# Patient Record
Sex: Male | Born: 1960 | Race: White | Hispanic: No | Marital: Married | Smoking: Current every day smoker
Health system: Southern US, Community
[De-identification: ages and names within clinical notes are randomized; demographics above are authoritative.]

## PROBLEM LIST (undated history)

## (undated) DIAGNOSIS — K5792 Diverticulitis of intestine, part unspecified, without perforation or abscess without bleeding: Secondary | ICD-10-CM

## (undated) DIAGNOSIS — I2510 Atherosclerotic heart disease of native coronary artery without angina pectoris: Secondary | ICD-10-CM

## (undated) DIAGNOSIS — I7300 Raynaud's syndrome without gangrene: Secondary | ICD-10-CM

## (undated) DIAGNOSIS — K5190 Ulcerative colitis, unspecified, without complications: Secondary | ICD-10-CM

## (undated) HISTORY — PX: CORONARY STENT PLACEMENT: SHX1402

---

## 1998-08-20 ENCOUNTER — Ambulatory Visit (HOSPITAL_COMMUNITY): Admission: RE | Admit: 1998-08-20 | Discharge: 1998-08-20 | Payer: Self-pay | Admitting: Gastroenterology

## 1999-01-17 ENCOUNTER — Encounter: Payer: Self-pay | Admitting: Emergency Medicine

## 1999-01-17 ENCOUNTER — Inpatient Hospital Stay (HOSPITAL_COMMUNITY): Admission: EM | Admit: 1999-01-17 | Discharge: 1999-01-20 | Payer: Self-pay | Admitting: Emergency Medicine

## 1999-01-17 ENCOUNTER — Encounter: Payer: Self-pay | Admitting: Pulmonary Disease

## 1999-11-19 ENCOUNTER — Emergency Department (HOSPITAL_COMMUNITY): Admission: EM | Admit: 1999-11-19 | Discharge: 1999-11-19 | Payer: Self-pay | Admitting: Emergency Medicine

## 2000-09-13 ENCOUNTER — Inpatient Hospital Stay (HOSPITAL_COMMUNITY): Admission: EM | Admit: 2000-09-13 | Discharge: 2000-09-17 | Payer: Self-pay | Admitting: Emergency Medicine

## 2000-09-13 ENCOUNTER — Encounter: Payer: Self-pay | Admitting: Emergency Medicine

## 2000-09-14 ENCOUNTER — Encounter: Payer: Self-pay | Admitting: *Deleted

## 2001-07-17 ENCOUNTER — Emergency Department (HOSPITAL_COMMUNITY): Admission: EM | Admit: 2001-07-17 | Discharge: 2001-07-17 | Payer: Self-pay | Admitting: Emergency Medicine

## 2002-01-11 ENCOUNTER — Emergency Department (HOSPITAL_COMMUNITY): Admission: EM | Admit: 2002-01-11 | Discharge: 2002-01-11 | Payer: Self-pay | Admitting: Emergency Medicine

## 2002-01-12 ENCOUNTER — Emergency Department (HOSPITAL_COMMUNITY): Admission: EM | Admit: 2002-01-12 | Discharge: 2002-01-12 | Payer: Self-pay | Admitting: Emergency Medicine

## 2003-10-10 ENCOUNTER — Emergency Department (HOSPITAL_COMMUNITY): Admission: EM | Admit: 2003-10-10 | Discharge: 2003-10-10 | Payer: Self-pay | Admitting: Emergency Medicine

## 2004-03-23 ENCOUNTER — Emergency Department (HOSPITAL_COMMUNITY): Admission: EM | Admit: 2004-03-23 | Discharge: 2004-03-24 | Payer: Self-pay | Admitting: Emergency Medicine

## 2004-11-29 ENCOUNTER — Ambulatory Visit: Payer: Self-pay | Admitting: Pulmonary Disease

## 2004-12-22 ENCOUNTER — Ambulatory Visit: Payer: Self-pay | Admitting: Gastroenterology

## 2005-01-03 ENCOUNTER — Ambulatory Visit: Payer: Self-pay | Admitting: Cardiovascular Disease

## 2005-01-18 ENCOUNTER — Ambulatory Visit: Payer: Self-pay

## 2005-02-14 ENCOUNTER — Ambulatory Visit: Payer: Self-pay | Admitting: Gastroenterology

## 2005-02-22 ENCOUNTER — Ambulatory Visit: Payer: Self-pay | Admitting: Gastroenterology

## 2005-03-17 ENCOUNTER — Ambulatory Visit: Payer: Self-pay | Admitting: Cardiovascular Disease

## 2006-04-27 ENCOUNTER — Emergency Department (HOSPITAL_COMMUNITY): Admission: EM | Admit: 2006-04-27 | Discharge: 2006-04-27 | Payer: Self-pay | Admitting: Emergency Medicine

## 2007-08-24 ENCOUNTER — Encounter: Payer: Self-pay | Admitting: Internal Medicine

## 2010-08-03 ENCOUNTER — Encounter: Payer: Self-pay | Admitting: Pulmonary Disease

## 2010-11-09 NOTE — Miscellaneous (Signed)
Summary: Flu Vaccination/[facility]  Flu Vaccination/[facility]   Imported By: Sherian Rein 08/13/2010 08:32:24  _____________________________________________________________________  External Attachment:    Type:   Image     Comment:   External Document

## 2012-05-09 ENCOUNTER — Emergency Department (HOSPITAL_COMMUNITY): Payer: BC Managed Care – PPO

## 2012-05-09 ENCOUNTER — Telehealth: Payer: Self-pay | Admitting: Pulmonary Disease

## 2012-05-09 ENCOUNTER — Emergency Department (HOSPITAL_COMMUNITY)
Admission: EM | Admit: 2012-05-09 | Discharge: 2012-05-09 | Disposition: A | Discharge status: Home / Self Care | Payer: BC Managed Care – PPO | Attending: Emergency Medicine | Admitting: Emergency Medicine

## 2012-05-09 ENCOUNTER — Encounter (HOSPITAL_COMMUNITY): Payer: Self-pay | Admitting: Emergency Medicine

## 2012-05-09 DIAGNOSIS — K625 Hemorrhage of anus and rectum: Secondary | ICD-10-CM | POA: Insufficient documentation

## 2012-05-09 DIAGNOSIS — N201 Calculus of ureter: Secondary | ICD-10-CM | POA: Insufficient documentation

## 2012-05-09 DIAGNOSIS — N23 Unspecified renal colic: Secondary | ICD-10-CM

## 2012-05-09 DIAGNOSIS — N200 Calculus of kidney: Secondary | ICD-10-CM

## 2012-05-09 DIAGNOSIS — K5730 Diverticulosis of large intestine without perforation or abscess without bleeding: Secondary | ICD-10-CM | POA: Insufficient documentation

## 2012-05-09 DIAGNOSIS — R109 Unspecified abdominal pain: Secondary | ICD-10-CM | POA: Insufficient documentation

## 2012-05-09 HISTORY — DX: Diverticulitis of intestine, part unspecified, without perforation or abscess without bleeding: K57.92

## 2012-05-09 HISTORY — DX: Raynaud's syndrome without gangrene: I73.00

## 2012-05-09 HISTORY — DX: Atherosclerotic heart disease of native coronary artery without angina pectoris: I25.10

## 2012-05-09 HISTORY — DX: Ulcerative colitis, unspecified, without complications: K51.90

## 2012-05-09 LAB — URINALYSIS, ROUTINE W REFLEX MICROSCOPIC
Ketones, ur: NEGATIVE mg/dL
Leukocytes, UA: NEGATIVE

## 2012-05-09 LAB — CBC WITH DIFFERENTIAL/PLATELET
Basophils Relative: 1 % (ref 0–1)
Hemoglobin: 16.9 g/dL (ref 13.0–17.0)
Lymphs Abs: 2.8 10*3/uL (ref 0.7–4.0)
MCH: 33.7 pg (ref 26.0–34.0)
MCV: 93.4 fL (ref 78.0–100.0)
Neutro Abs: 8.5 10*3/uL — ABNORMAL HIGH (ref 1.7–7.7)
RDW: 13.1 % (ref 11.5–15.5)
WBC: 12.7 10*3/uL — ABNORMAL HIGH (ref 4.0–10.5)

## 2012-05-09 LAB — BASIC METABOLIC PANEL
BUN: 9 mg/dL (ref 6–23)
CO2: 29 mEq/L (ref 19–32)
Calcium: 9.6 mg/dL (ref 8.4–10.5)
Creatinine, Ser: 1.03 mg/dL (ref 0.50–1.35)
GFR calc Af Amer: >90 mL/min (ref >90)
GFR calc non Af Amer: 82 mL/min — ABNORMAL LOW (ref >90)
Glucose, Bld: 89 mg/dL (ref 70–99)

## 2012-05-09 LAB — URINE MICROSCOPIC-ADD ON

## 2012-05-09 MED ORDER — MORPHINE SULFATE 4 MG/ML IJ SOLN
4.0000 mg | Freq: Once | INTRAMUSCULAR | Status: AC
  Administered 2012-05-09: 4 mg via INTRAVENOUS
  Filled 2012-05-09: qty 1

## 2012-05-09 MED ORDER — OXYCODONE-ACETAMINOPHEN 5-325 MG PO TABS: 1.0000 | ORAL_TABLET | ORAL | Status: AC | PRN

## 2012-05-09 MED ORDER — TAMSULOSIN HCL 0.4 MG PO CAPS: 0.4000 mg | ORAL_CAPSULE | ORAL | Status: AC

## 2012-05-09 NOTE — ED Notes (Signed)
MD at bedside. 

## 2012-05-09 NOTE — ED Provider Notes (Addendum)
History     CSN: [id number]  Arrival date & time 05/09/12  1545   First MD Initiated Contact with Patient 05/09/12 1733      Chief Complaint  Patient presents with  . Nephrolithiasis    (Consider location/radiation/quality/duration/timing/severity/associated sxs/prior treatment) HPI Comments: Patient presents with several days of right flank pain radiating down towards his right groin.  He states it is consistent with past kidney stones.  He did pass a kidney stone on Monday but has had persistent pain although less intense.  He has not noted any gross hematuria or dysuria.  Yesterday he also noted some gross bright red blood per rectum with bowel movements.  This occurred on 3 occasions.  He's had no further episodes today.  He has a known history of ulcerative colitis and diverticulosis as well.  He notes that several times a year he will have exacerbations with bleeding.  He denies any other abdominal pain, fevers or nausea or vomiting today.  He did have associated nausea and vomiting on Monday when he passed a kidney stone.  Patient is not on any anticoagulation or antiplatelet agents.  He has no left lower quadrant pain.  He did take to ciprofloxacin yesterday out of concern for possible diverticulitis.  The history is provided by the patient. No language interpreter was used.    Past Medical History  Diagnosis Date  . Coronary artery disease   . Raynaud disease   . Ulcerative colitis   . Diverticulitis     Past Surgical History  Procedure Date  . Coronary stent placement     History reviewed. No pertinent family history.  History  Substance Use Topics  . Smoking status: Current Everyday Smoker  . Smokeless tobacco: Not on file  . Alcohol Use: No      Review of Systems  Constitutional: Negative.  Negative for fever and chills.  HENT: Negative.   Eyes: Negative.   Respiratory: Negative.  Negative for cough and shortness of breath.   Cardiovascular: Negative.   Negative for chest pain.  Gastrointestinal: Positive for nausea, vomiting and anal bleeding. Negative for abdominal pain.  Genitourinary: Positive for hematuria and flank pain.  Musculoskeletal: Negative.  Negative for back pain.  Skin: Negative.  Negative for color change and rash.  Neurological: Negative for syncope and headaches.  Hematological: Negative.  Negative for adenopathy.  Psychiatric/Behavioral: Negative.  Negative for confusion.  All other systems reviewed and are negative.    Allergies  Review of patient's allergies indicates no known allergies.  Home Medications   Current Outpatient Rx  Name Route Sig Dispense Refill  . ALPRAZOLAM ER 2 MG PO TB24 Oral Take 2 mg by mouth every morning.    Marland Kitchen ALPRAZOLAM 1 MG PO TABS Oral Take 1 mg by mouth 3 (three) times daily as needed. anxiety    . HYDROCODONE-ACETAMINOPHEN 5-325 MG PO TABS Oral Take 1 tablet by mouth every 6 (six) hours as needed. pain    . LISINOPRIL 40 MG PO TABS Oral Take 40 mg by mouth daily.      BP 123/83  Pulse 80  Temp 98.6 F (37 C)  SpO2 100%  Physical Exam  Nursing note and vitals reviewed. Constitutional: He is oriented to person, place, and time. He appears well-developed and well-nourished.  Non-toxic appearance. He does not have a sickly appearance.  HENT:  Head: Normocephalic and atraumatic.  Eyes: Conjunctivae, EOM and lids are normal. Pupils are equal, round, and reactive to light.  Neck:  Trachea normal, normal range of motion and full passive range of motion without pain. Neck supple.  Cardiovascular: Normal rate, regular rhythm and normal heart sounds.  Exam reveals no gallop.   No murmur heard. Pulmonary/Chest: Effort normal and breath sounds normal. No respiratory distress.  Abdominal: Soft. Normal appearance. He exhibits no distension. There is no tenderness. There is no rebound and no CVA tenderness.  Genitourinary:       Normal rectal tone.  Brown stool.  No gross blood or melena    Musculoskeletal: Normal range of motion.  Neurological: He is alert and oriented to person, place, and time. He has normal strength.  Skin: Skin is warm, dry and intact. No rash noted.  Psychiatric: He has a normal mood and affect. His behavior is normal. Judgment and thought content normal.    ED Course  Procedures (including critical care time)  Results for orders placed during the hospital encounter of 05/09/12  URINALYSIS, ROUTINE W REFLEX MICROSCOPIC      Component Value Range   Color, Urine YELLOW  YELLOW   APPearance CLEAR  CLEAR   Specific Gravity, Urine 1.009  1.005 - 1.030   pH 6.5  5.0 - 8.0   Glucose, UA NEGATIVE  NEGATIVE mg/dL   Hgb urine dipstick MODERATE (*) NEGATIVE   Bilirubin Urine NEGATIVE  NEGATIVE   Ketones, ur NEGATIVE  NEGATIVE mg/dL   Protein, ur NEGATIVE  NEGATIVE mg/dL   Urobilinogen, UA 0.2  0.0 - 1.0 mg/dL   Nitrite NEGATIVE  NEGATIVE   Leukocytes, UA NEGATIVE  NEGATIVE  BASIC METABOLIC PANEL      Component Value Range   Sodium 139  135 - 145 mEq/L   Potassium 3.7  3.5 - 5.1 mEq/L   Chloride 102  96 - 112 mEq/L   CO2 29  19 - 32 mEq/L   Glucose, Bld 89  70 - 99 mg/dL   BUN 9  6 - 23 mg/dL   Creatinine, Ser 9.62  0.50 - 1.35 mg/dL   Calcium 9.6  8.4 - 95.2 mg/dL   GFR calc non Af Amer 82 (*) >90 mL/min   GFR calc Af Amer >90  >90 mL/min  CBC WITH DIFFERENTIAL      Component Value Range   WBC 12.7 (*) 4.0 - 10.5 K/uL   RBC 5.01  4.22 - 5.81 MIL/uL   Hemoglobin 16.9  13.0 - 17.0 g/dL   HCT 84.1  32.4 - 40.1 %   MCV 93.4  78.0 - 100.0 fL   MCH 33.7  26.0 - 34.0 pg   MCHC 36.1 (*) 30.0 - 36.0 g/dL   RDW 02.7  25.3 - 66.4 %   Platelets 281  150 - 400 K/uL   Neutrophils Relative 67  43 - 77 %   Neutro Abs 8.5 (*) 1.7 - 7.7 K/uL   Lymphocytes Relative 22  12 - 46 %   Lymphs Abs 2.8  0.7 - 4.0 K/uL   Monocytes Relative 9  3 - 12 %   Monocytes Absolute 1.1 (*) 0.1 - 1.0 K/uL   Eosinophils Relative 1  0 - 5 %   Eosinophils Absolute 0.2  0.0 -  0.7 K/uL   Basophils Relative 1  0 - 1 %   Basophils Absolute 0.1  0.0 - 0.1 K/uL  URINE MICROSCOPIC-ADD ON      Component Value Range   RBC / HPF 3-6  <3 RBC/hpf   Ct Abdomen Pelvis Wo Contrast  05/09/2012  *  RADIOLOGY REPORT*  Clinical Data: Right flank pain.  History of stones.  History of rectal bleeding with UC and diverticulosis.  History of kidney stones.  CT ABDOMEN AND PELVIS WITHOUT CONTRAST  Technique:  Multidetector CT imaging of the abdomen and pelvis was performed following the standard protocol without intravenous contrast.  Comparison: CT 03/23/2004  Findings: The lung bases are unremarkable in appearance.  There is moderate right hydronephrosis and hydroureter secondary to a stone in the ureter which measures 5 mm.  This is seen at the level of L4-5.  No focal abnormality identified within the liver, spleen, pancreas, or adrenal glands.  Low attenuation lesion is identified within the upper pole of the left kidney.  This measures 2.2 cm and likely represents a cyst.  No intrarenal or ureteral stones are identified on the left.  The stomach and small bowel loops have a normal appearance. The appendix is well seen and has a normal appearance.  There are numerous diverticula throughout the colon, especially involving the descending and sigmoid segments.  No associated inflammatory changes to suggest acute diverticulitis.  Gallbladder is present.  Prostatic calcifications are present.  There is atherosclerotic calcification of the abdominal aorta.  No aneurysm. No retroperitoneal or mesenteric adenopathy.  Degenerative changes are seen in the spine.  IMPRESSION:  1.  5 mm right ureteral stone at the level of L4-5, causing right hydronephrosis and hydroureter. 2.  Significant diverticulosis without evidence for acute diverticulitis.  Original Report Authenticated By: Patterson Hammersmith, M.D.      MDM  Patient presents with symptoms that are likely consistent with a kidney stone.  He does  give the history for rectal bleeding but has no left lower quadrant tenderness, fevers or other signs of infection to suggest diverticulitis clinically.  Patient has no gross blood or melena on exam.  He's had no bleeding today as well.  Given patient's history for ulcerative colitis this is likely bleeding related to that and his underlying diverticulosis.  Patient will have a CT scan to assess for further kidney stones at this time.  Patient does not require pain medicine at this time since he took oxycodone at home.   Nat Christen, MD 05/09/12 [phone]  Patient with a CT that is consistent with kidney stone which I believe is most consistent with the patient's pain.  I will advise the patient that he should not continue to take the ciprofloxacin.  I have counseled him that he should have followup with his GI specialist given the bleeding and his known history of ulcerative colitis which puts him at higher risk for cancer.  Patient understands this and he plans to followup.  I believe patient is safe for discharge home.    Nat Christen, MD 05/09/12 2011

## 2012-05-09 NOTE — ED Notes (Addendum)
Pt has hx of kidney stones and current symptoms started Monday, reports having a kidney stone flare up with c/o right flank pain.  Was seen at urgent care Monday for right flank pain.  Pt also reports bright red blood in stool  Yesterday.  Pt currently taking cipro for diverticulitis.

## 2012-05-09 NOTE — Telephone Encounter (Signed)
Please advise SN thanks 

## 2012-05-09 NOTE — Telephone Encounter (Signed)
Per SN---we have no openings --unable to take new pts at this time.  Suggest [place] [facility] or one of the satellite office is near him to set him up with primary care.  thanks

## 2012-05-09 NOTE — Telephone Encounter (Signed)
[id number] x1 

## 2012-05-10 NOTE — Telephone Encounter (Signed)
Pt aware. Jennifer Castillo, CMA  

## 2012-09-10 ENCOUNTER — Emergency Department (HOSPITAL_COMMUNITY)
Admission: EM | Admit: 2012-09-10 | Discharge: 2012-09-10 | Disposition: A | Discharge status: Home / Self Care | Payer: BC Managed Care – PPO | Source: Home / Self Care | Attending: Emergency Medicine | Admitting: Emergency Medicine

## 2012-09-10 ENCOUNTER — Encounter (HOSPITAL_COMMUNITY): Payer: Self-pay | Admitting: Emergency Medicine

## 2012-09-10 DIAGNOSIS — Z5189 Encounter for other specified aftercare: Secondary | ICD-10-CM

## 2012-09-10 DIAGNOSIS — L0291 Cutaneous abscess, unspecified: Secondary | ICD-10-CM

## 2012-09-10 DIAGNOSIS — I2510 Atherosclerotic heart disease of native coronary artery without angina pectoris: Secondary | ICD-10-CM

## 2012-09-10 DIAGNOSIS — L0390 Cellulitis, unspecified: Secondary | ICD-10-CM

## 2012-09-10 MED ORDER — SULFAMETHOXAZOLE-TMP DS 800-160 MG PO TABS: 2.0000 | ORAL_TABLET | Freq: Two times a day (BID) | ORAL | Status: DC

## 2012-09-10 MED ORDER — TRAMADOL HCL 50 MG PO TABS: 100.0000 mg | ORAL_TABLET | Freq: Three times a day (TID) | ORAL | Status: DC | PRN

## 2012-09-10 MED ORDER — LISINOPRIL 40 MG PO TABS: 40.0000 mg | ORAL_TABLET | Freq: Every day | ORAL | Status: AC

## 2012-09-10 MED ORDER — HYDROCODONE-ACETAMINOPHEN 5-325 MG PO TABS: ORAL_TABLET | ORAL | Status: AC

## 2012-09-10 NOTE — ED Notes (Addendum)
Reports he has a nodule on his upper left buttocks.  Patient says this past week it started getting bigger and redder.    Patient states he did start taking CIPRO 500 mg for three days.  Refill on bp medication

## 2012-09-10 NOTE — ED Provider Notes (Signed)
Chief Complaint  Patient presents with  . Abscess  . Medication Refill    History of Present Illness:    Webster is a 51 year old male who has had a one-week history of a boil on his left buttock. He got an injection in that area about 4-5 months ago and has had a small pea-sized lump ever since then. The injection was promethazine plus another pain medication. He got this at [facility] and then he came over here because of a kidney stone. The lump has gotten bigger over the past week and is tender to touch. It has not drained any fluid or pus. He denies any fever or chills.  He also needs a refill for his lisinopril 40 mg a day. He has hypertension coronary artery disease. He had a heart attack in 2001. He's being followed by Dr. Eden Emms. He denies any chest pain, pressure, tightness, or shortness of breath. He does not have her primary care physician.  Review of Systems:  Other than noted above, the patient denies any of the following symptoms: Systemic:  No fever, chills or sweats. Skin:  No rash or itching.  PMFSH:  Past medical history, family history, social history, meds, and allergies were reviewed.  No history of diabetes or prior history of abscesses or MRSA.  Physical Exam:   Vital signs:  BP 134/87  Pulse 81  Temp 99.4 F (37.4 C) (Oral)  Resp 18  SpO2 99% Skin:  There is a 2.5 cm, round, raised, red, fluctuant mass in the upper outer quadrant of the left buttock.  Skin exam was otherwise normal.  No rash. Ext:  Distal pulses were full, patient has full ROM of all joints.    Procedure:  Verbal informed consent was obtained.  The patient was informed of the risks and benefits of the procedure and understands and accepts.  Identity of the patient was verified verbally and by wristband.   The abscess area described above was prepped with Betadine and alcohol and anesthetized with 4 mL of 2% Xylocaine with epinephrine.  Using a #11 scalpel blade, a singe straight  incision was made into the area of fluctulence, yielding a moderate amount of prurulent drainage.  Routine cultures were obtained.  Blunt dissection was used to break up loculations and the resulting wound cavity was packed with 1/4 inch Iodoform gauze.  A sterile pressure dressing was applied.  Assessment:  The primary encounter diagnosis was Abscess. A diagnosis of Coronary artery disease was also pertinent to this visit.  Plan:   1.  The following meds were prescribed:   New Prescriptions   HYDROCODONE-ACETAMINOPHEN (NORCO/VICODIN) 5-325 MG PER TABLET    1 to 2 tabs every 4 to 6 hours as needed for pain.   LISINOPRIL (PRINIVIL,ZESTRIL) 40 MG TABLET    Take 1 tablet (40 mg total) by mouth daily.   SULFAMETHOXAZOLE-TRIMETHOPRIM (BACTRIM DS) 800-160 MG PER TABLET    Take 2 tablets by mouth 2 (two) times daily.   TRAMADOL (ULTRAM) 50 MG TABLET    Take 2 tablets (100 mg total) by mouth every 8 (eight) hours as needed for pain.   2.  The patient was instructed in symptomatic care and handouts were given. 3.  The patient was instructed to leave the dressing in place and return again in 48 hours for packing removal.   Reuben Likes, MD 09/10/12 1451

## 2012-09-12 ENCOUNTER — Emergency Department (INDEPENDENT_AMBULATORY_CARE_PROVIDER_SITE_OTHER)
Admission: EM | Admit: 2012-09-12 | Discharge: 2012-09-12 | Disposition: A | Discharge status: Home / Self Care | Payer: BC Managed Care – PPO | Source: Home / Self Care | Attending: Emergency Medicine | Admitting: Emergency Medicine

## 2012-09-12 ENCOUNTER — Encounter (HOSPITAL_COMMUNITY): Payer: Self-pay | Admitting: *Deleted

## 2012-09-12 DIAGNOSIS — Z5189 Encounter for other specified aftercare: Secondary | ICD-10-CM

## 2012-09-12 NOTE — ED Notes (Signed)
Pt  Here  For  Recheck       Of  abcess     Possible  Packing  Removal

## 2012-09-12 NOTE — ED Provider Notes (Signed)
History     CSN: [id number]  Arrival date & time 09/12/12  0818   None     Chief Complaint  Patient presents with  . Wound Check    wound  check    (Consider location/radiation/quality/duration/timing/severity/associated sxs/prior treatment) Patient is a 51 y.o. male presenting with wound check. The history is provided by the patient.  Wound Check  He was treated in the ED 2 to 3 days ago. Previous treatment in the ED includes I&D of abscess. Treatments since wound repair include a wound recheck and oral antibiotics. The fever has been present for less than 1 day. There has been bloody discharge from the wound. The redness has improved. The swelling has improved. The pain has improved. He has no difficulty moving the affected extremity or digit.    Past Medical History  Diagnosis Date  . Coronary artery disease   . Raynaud disease   . Ulcerative colitis   . Diverticulitis     Past Surgical History  Procedure Date  . Coronary stent placement     No family history on file.  History  Substance Use Topics  . Smoking status: Current Every Day Smoker  . Smokeless tobacco: Not on file  . Alcohol Use: No      Review of Systems  Skin: Positive for wound.  All other systems reviewed and are negative.    Allergies  Review of patient's allergies indicates no known allergies.  Home Medications   Current Outpatient Rx  Name  Route  Sig  Dispense  Refill  . ALPRAZOLAM ER 2 MG PO TB24   Oral   Take 2 mg by mouth every morning.         Marland Kitchen ALPRAZOLAM 1 MG PO TABS   Oral   Take 1 mg by mouth 3 (three) times daily as needed. anxiety         . HYDROCODONE-ACETAMINOPHEN 5-325 MG PO TABS   Oral   Take 1 tablet by mouth every 6 (six) hours as needed. pain         . HYDROCODONE-ACETAMINOPHEN 5-325 MG PO TABS      1 to 2 tabs every 4 to 6 hours as needed for pain.   20 tablet   0   . LISINOPRIL 40 MG PO TABS   Oral   Take 40 mg by mouth daily.         Marland Kitchen  LISINOPRIL 40 MG PO TABS   Oral   Take 1 tablet (40 mg total) by mouth daily.   30 tablet   3   . SULFAMETHOXAZOLE-TMP DS 800-160 MG PO TABS   Oral   Take 2 tablets by mouth 2 (two) times daily.   40 tablet   0   . TAMSULOSIN HCL 0.4 MG PO CAPS   Oral   Take 1 capsule (0.4 mg total) by mouth daily after supper.   7 capsule   0   . TRAMADOL HCL 50 MG PO TABS   Oral   Take 2 tablets (100 mg total) by mouth every 8 (eight) hours as needed for pain.   30 tablet   0     BP 128/72  Pulse 80  Temp 98.1 F (36.7 C) (Oral)  Resp 17  SpO2 100%  Physical Exam  Nursing note and vitals reviewed. Constitutional: He is oriented to person, place, and time. Vital signs are normal. He appears well-developed and well-nourished. He is active and cooperative.  HENT:  Head:  Normocephalic.  Eyes: Conjunctivae normal are normal. Pupils are equal, round, and reactive to light. No scleral icterus.  Neck: Trachea normal. Neck supple.  Cardiovascular: Normal rate, regular rhythm, normal heart sounds and intact distal pulses.   Pulmonary/Chest: Effort normal and breath sounds normal.  Musculoskeletal: Normal range of motion.  Neurological: He is alert and oriented to person, place, and time. No cranial nerve deficit or sensory deficit.  Skin: Skin is warm and dry.     Psychiatric: He has a normal mood and affect. His speech is normal and behavior is normal. Judgment and thought content normal. Cognition and memory are normal.    ED Course  Procedures (including critical care time)  Labs Reviewed - No data to display No results found.   1. Wound check, abscess       MDM  Packing removed, pt tolerated well, site cleaned with clean dry dressing applied.  Continue antibiotics as prescribed.  Daily cleanse and dressing until healed.  rtc prn.          Johnsie Kindred, NP 09/12/12 [phone]

## 2012-09-13 LAB — CULTURE, ROUTINE-ABSCESS: Culture: NO GROWTH

## 2012-09-13 NOTE — ED Provider Notes (Signed)
Medical screening examination/treatment/procedure(s) were performed by non-physician practitioner and as supervising physician I was immediately available for consultation/collaboration.  Leslee Home, M.D.   Reuben Likes, MD 09/13/12 [phone]

## 2012-11-24 ENCOUNTER — Other Ambulatory Visit: Payer: Self-pay

## 2013-08-15 ENCOUNTER — Other Ambulatory Visit: Payer: Self-pay

## 2014-01-20 IMAGING — CT CT ABD-PELV W/O CM
1 series · 15 of 32 positions shown, 19 images · non-contrast
Comparison: CT 03/23/2004

CLINICAL DATA: Right flank pain.  History of stones.  History of
rectal bleeding with UC and diverticulosis.  History of kidney
stones.

CT ABDOMEN AND PELVIS WITHOUT CONTRAST
TECHNIQUE: Multidetector CT imaging of the abdomen and pelvis was
performed following the standard protocol without intravenous
contrast.

[Series 6: lung · axial · 0.82mm/px · z∈[-356,-201]mm · 15 of 36 slices shown, 19 images]
[im 3/36  soft-tissue]
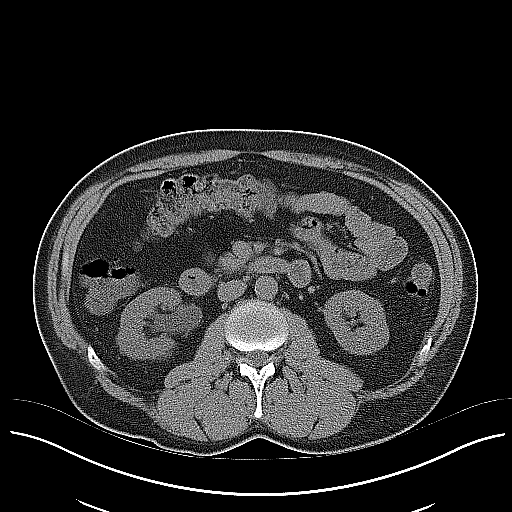
[im 3/36  bone]
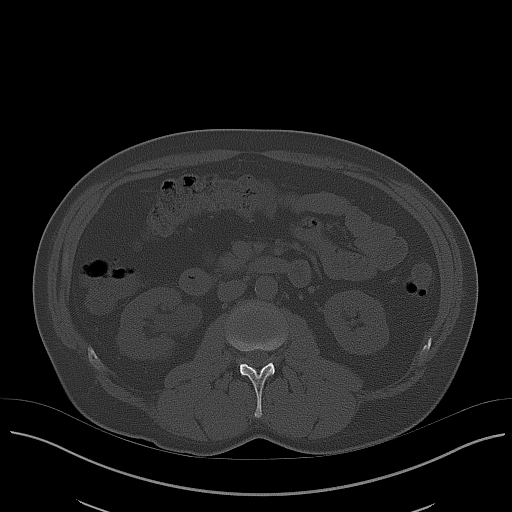
[im 5/36  soft-tissue]
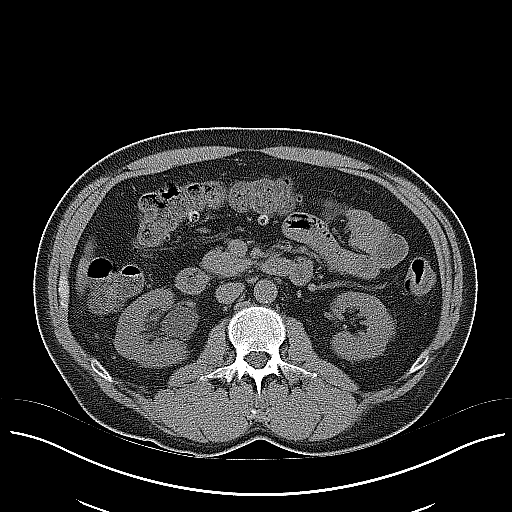
[im 7/36  soft-tissue]
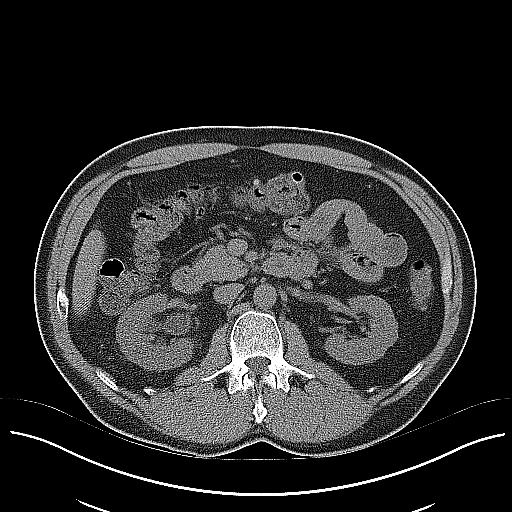
[im 11/36  soft-tissue]
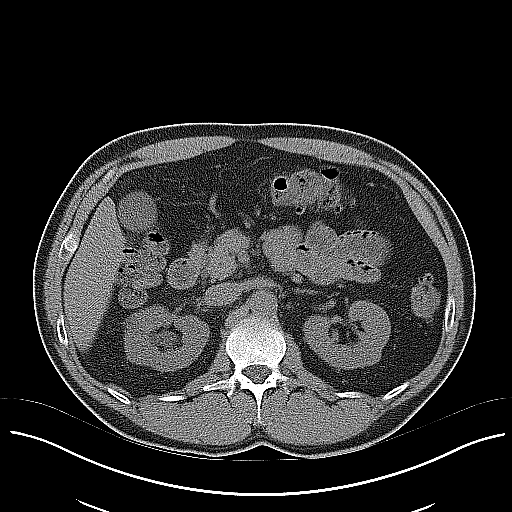
[im 13/36  soft-tissue]
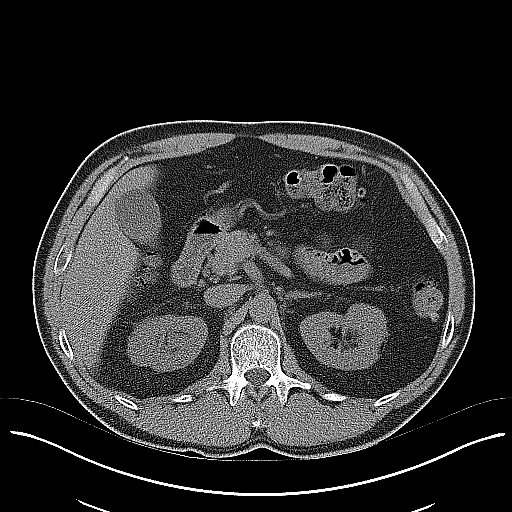
[im 15/36  soft-tissue]
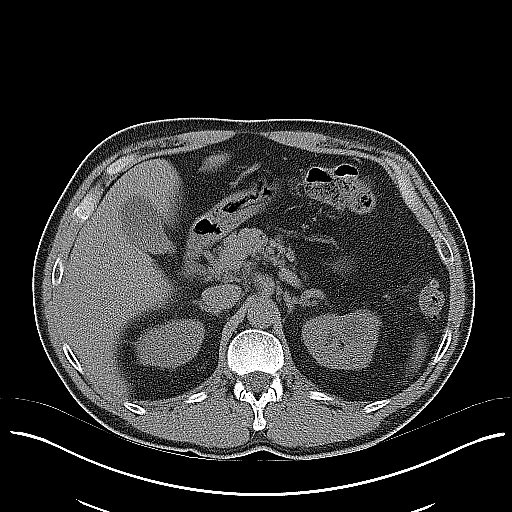
[im 19/36  soft-tissue]
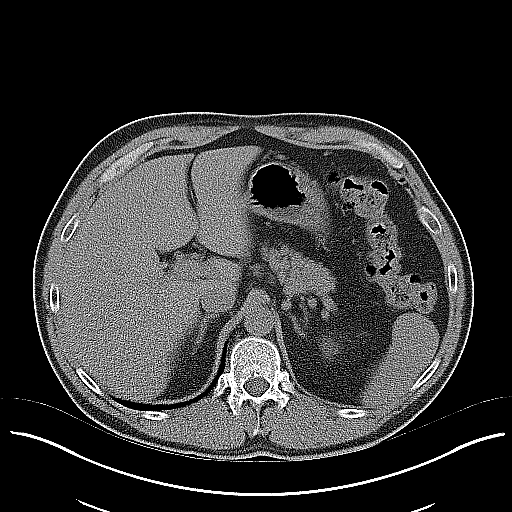
[im 21/36  soft-tissue]
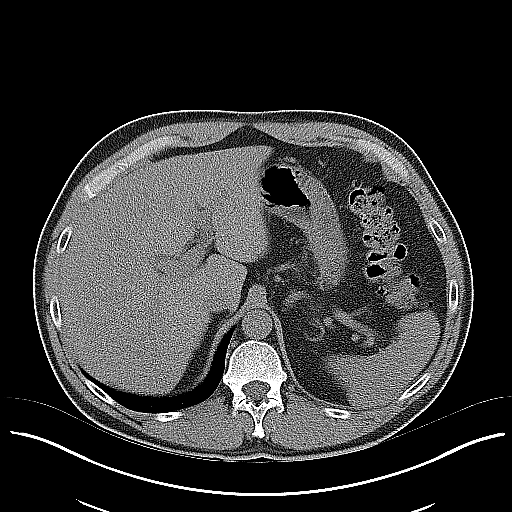
[im 23/36  soft-tissue]
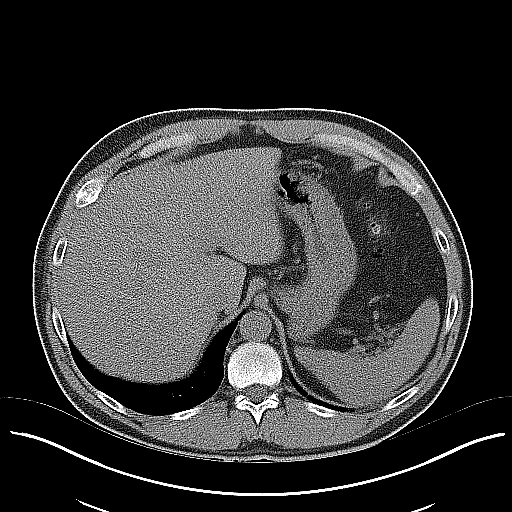
[im 23/36  bone]
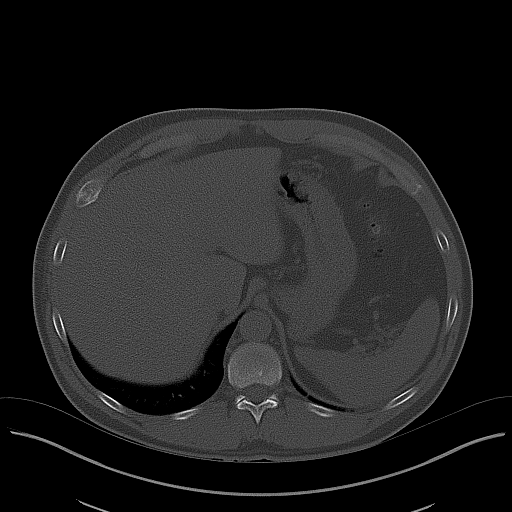
[im 25/36  soft-tissue]
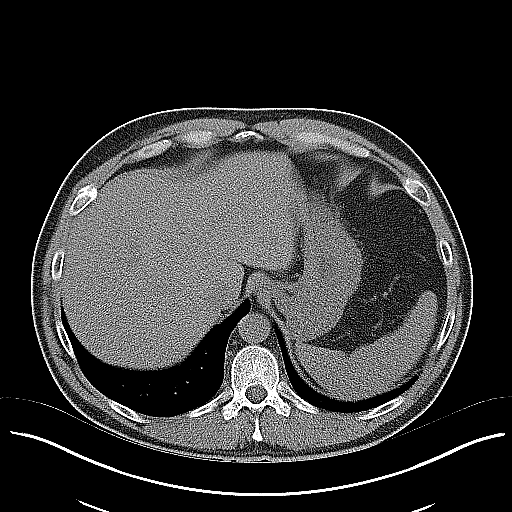
[im 29/36  soft-tissue]
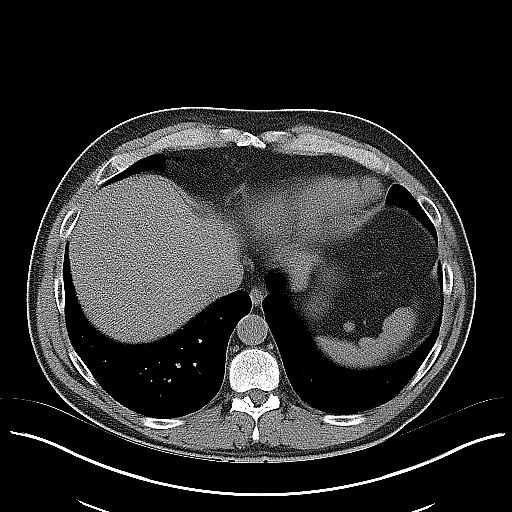
[im 31/36  soft-tissue]
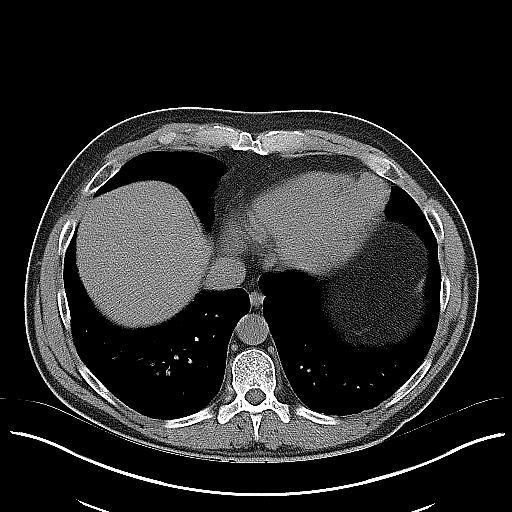
[im 31/36  lung]
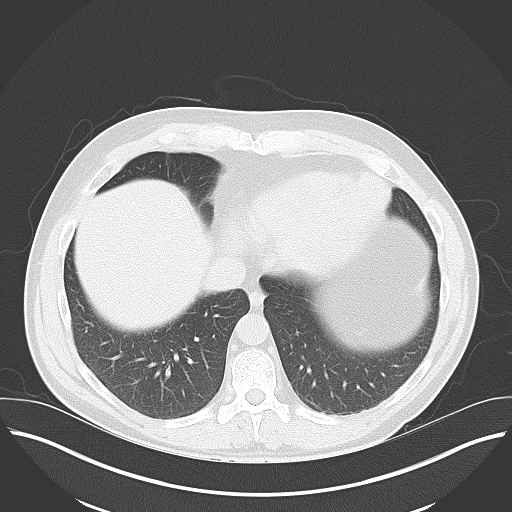
[im 32/36  lung]
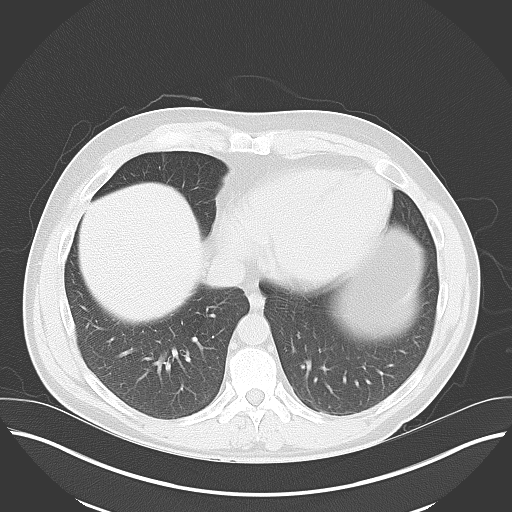
[im 33/36  soft-tissue]
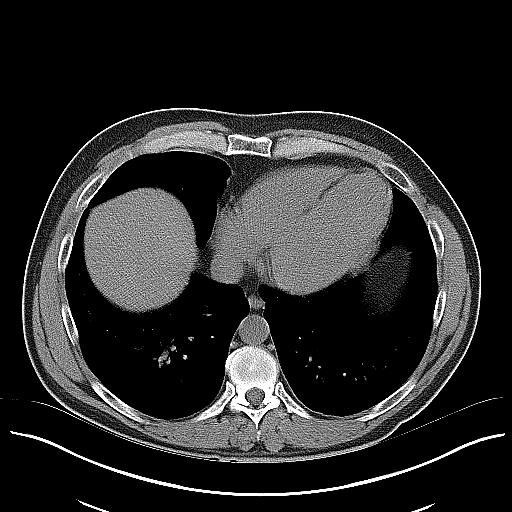
[im 33/36  lung]
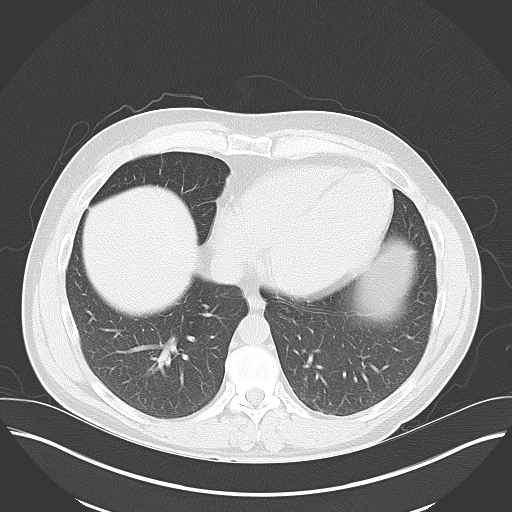
[im 34/36  lung]
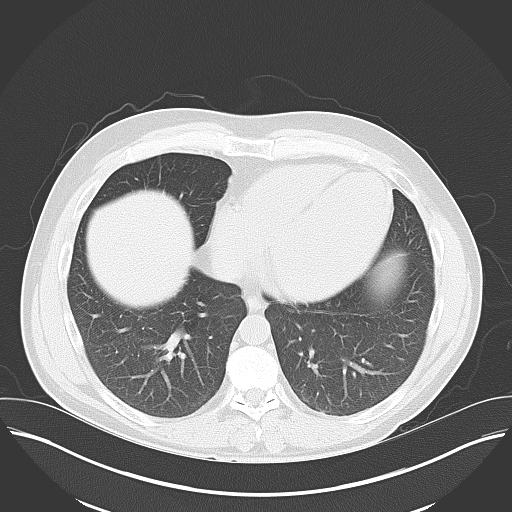

[15 of 32 positions shown; findings below may reference images not displayed]

FINDINGS: The lung bases are unremarkable in appearance.

There is moderate right hydronephrosis and hydroureter secondary to
a stone in the ureter which measures 5 mm.  This is seen at the
level of L4-5.

No focal abnormality identified within the liver, spleen, pancreas,
or adrenal glands.  Low attenuation lesion is identified within the
upper pole of the left kidney.  This measures 2.2 cm and likely
represents a cyst.  No intrarenal or ureteral stones are identified
on the left.

The stomach and small bowel loops have a normal appearance. The
appendix is well seen and has a normal appearance.  There are
numerous diverticula throughout the colon, especially involving the
descending and sigmoid segments.  No associated inflammatory
changes to suggest acute diverticulitis.  Gallbladder is present.

Prostatic calcifications are present.  There is atherosclerotic
calcification of the abdominal aorta.  No aneurysm. No
retroperitoneal or mesenteric adenopathy.  Degenerative changes are
seen in the spine.
IMPRESSION: 1.  5 mm right ureteral stone at the level of L4-5, causing right
hydronephrosis and hydroureter.
2.  Significant diverticulosis without evidence for acute
diverticulitis.

## 2014-06-11 ENCOUNTER — Emergency Department (HOSPITAL_COMMUNITY)
Admission: EM | Admit: 2014-06-11 | Discharge: 2014-06-11 | Disposition: A | Discharge status: Home / Self Care | Payer: BC Managed Care – PPO | Source: Home / Self Care | Attending: Family Medicine | Admitting: Family Medicine

## 2014-06-11 ENCOUNTER — Encounter (HOSPITAL_COMMUNITY): Payer: Self-pay | Admitting: Emergency Medicine

## 2014-06-11 DIAGNOSIS — L723 Sebaceous cyst: Secondary | ICD-10-CM

## 2014-06-11 DIAGNOSIS — L089 Local infection of the skin and subcutaneous tissue, unspecified: Secondary | ICD-10-CM

## 2014-06-11 MED ORDER — TRAMADOL HCL 50 MG PO TABS: 50.0000 mg | ORAL_TABLET | Freq: Four times a day (QID) | ORAL | Status: AC | PRN

## 2014-06-11 MED ORDER — SULFAMETHOXAZOLE-TMP DS 800-160 MG PO TABS: 1.0000 | ORAL_TABLET | Freq: Two times a day (BID) | ORAL | Status: AC

## 2014-06-11 MED ORDER — LIDOCAINE HCL (PF) 2 % IJ SOLN
INTRAMUSCULAR | Status: AC
  Filled 2014-06-11: qty 2

## 2014-06-11 NOTE — ED Notes (Signed)
C/o has a spot on his back x couple of months, but has only become sore past 2 weeks

## 2014-06-11 NOTE — Discharge Instructions (Signed)
You can pull out 1cm of packing every day until all the packing has been removed. Keep dressing on the area until the incision has closed. You can use over the counter bacterial ointment before applying the dressing. You can follow-up with your primary care physician as needed. Please take all prescribed antibiotics.   Epidermal Cyst An epidermal cyst is usually a small, painless lump under the skin. Cysts often occur on the face, neck, stomach, chest, or genitals. The cyst may be filled with a bad smelling paste. Do not pop your cyst. Popping the cyst can cause pain and puffiness (swelling). HOME CARE   Only take medicines as told by your doctor.  Take your medicine (antibiotics) as told. Finish it even if you start to feel better. GET HELP RIGHT AWAY IF:  Your cyst is tender, red, or puffy.  You are not getting better, or you are getting worse.  You have any questions or concerns. MAKE SURE YOU:  Understand these instructions.  Will watch your condition.  Will get help right away if you are not doing well or get worse. Document Released: 11/03/2004 Document Revised: 03/27/2012 Document Reviewed: 04/04/2011 [facility] Patient Information 2015 [place], [place]. This information is not intended to replace advice given to you by your health care provider. Make sure you discuss any questions you have with your health care provider.

## 2014-06-11 NOTE — ED Provider Notes (Signed)
Medical screening examination/treatment/procedure(s) were performed by a resident physician or non-physician practitioner and as the supervising physician I was immediately available for consultation/collaboration.  Shelly Flatten, MD Family Medicine   Ozella Rocks, MD 06/11/14 [phone]

## 2014-06-11 NOTE — ED Provider Notes (Signed)
CSN: [id number]     Arrival date & time 06/11/14  1338 History   First MD Initiated Contact with Patient 06/11/14 1408     Chief Complaint  Patient presents with  . Skin Problem   (Consider location/radiation/quality/duration/timing/severity/associated sxs/prior Treatment) HPI Patient presents with a nodule on the right side of his lower back. It started off as a small nodule that increased in size over the past two weeks. Tenderness when pressure is applied. No fever, chills, nausea or vomiting. No sick contacts. No recent travel.  Past Medical History  Diagnosis Date  . Coronary artery disease   . Raynaud disease   . Ulcerative colitis   . Diverticulitis    Past Surgical History  Procedure Laterality Date  . Coronary stent placement     History reviewed. No pertinent family history. History  Substance Use Topics  . Smoking status: Current Every Day Smoker  . Smokeless tobacco: Not on file  . Alcohol Use: No    Review of Systems  Constitutional: Negative for fever, chills and fatigue.  Gastrointestinal: Negative for nausea and vomiting.    Allergies  Review of patient's allergies indicates no known allergies.  Home Medications   Prior to Admission medications   Medication Sig Start Date End Date Taking? Authorizing Provider  ALPRAZolam Prudy Feeler) 1 MG tablet Take 1 mg by mouth 3 (three) times daily as needed. anxiety   Yes Historical Provider, MD  ALPRAZolam (XANAX XR) 2 MG 24 hr tablet Take 2 mg by mouth every morning.    Historical Provider, MD  HYDROcodone-acetaminophen (NORCO/VICODIN) 5-325 MG per tablet Take 1 tablet by mouth every 6 (six) hours as needed. pain    Historical Provider, MD  HYDROcodone-acetaminophen (NORCO/VICODIN) 5-325 MG per tablet 1 to 2 tabs every 4 to 6 hours as needed for pain. 09/10/12   Reuben Likes, MD  lisinopril (PRINIVIL,ZESTRIL) 40 MG tablet Take 40 mg by mouth daily.    Historical Provider, MD  lisinopril (PRINIVIL,ZESTRIL) 40 MG tablet  Take 1 tablet (40 mg total) by mouth daily. 09/10/12   Reuben Likes, MD  sulfamethoxazole-trimethoprim (BACTRIM DS) 800-160 MG per tablet Take 1 tablet by mouth 2 (two) times daily. 06/11/14   Jacquelin Hawking, MD  Tamsulosin HCl (FLOMAX) 0.4 MG CAPS Take 1 capsule (0.4 mg total) by mouth daily after supper. 05/09/12   Nat Christen, MD  traMADol (ULTRAM) 50 MG tablet Take 1 tablet (50 mg total) by mouth every 6 (six) hours as needed. 06/11/14   Jacquelin Hawking, MD   BP 135/78  Pulse 70  Temp(Src) 98.5 F (36.9 C) (Oral)  SpO2 98% Physical Exam  Constitutional: He appears well-developed and well-nourished.  Skin:  Cystic lesion on right lower flank. Tenderness on palpation with erythema. Area is not warm compared to surroundings.    ED Course  INCISION AND DRAINAGE Date/Time: 06/11/2014 3:40 PM Performed by: Jacquelin Hawking Authorized by: Ozella Rocks Consent: Verbal consent obtained. Risks and benefits: risks, benefits and alternatives were discussed Consent given by: patient Patient identity confirmed: verbally with patient Type: cyst Body area: trunk Location details: back Anesthesia: local infiltration Local anesthetic: lidocaine 2% without epinephrine Patient sedated: no Scalpel size: 11 Needle gauge: 30. Incision type: single straight Complexity: simple Drainage: bloody (sebaceous material) Drainage amount: moderate Wound treatment: wound left open Packing material: 1/4 in iodoform gauze Patient tolerance: Patient tolerated the procedure well with no immediate complications.   (including critical care time) Labs Review Labs Reviewed - No data  to display  Imaging Review No results found.   MDM   1. Infected sebaceous cyst    Bactrim prescribed for antibiotic coverage. Tramadol for pain. Patient given instructions for continued care. Follow-up with PCP as needed. Explained that he may need procedure to fully remove cyst at a later time. Patient understood and agreed  with plan. Stable for discharge home.  Jacquelin Hawking, MD 06/11/14 [phone]

## 2014-06-12 ENCOUNTER — Telehealth (HOSPITAL_COMMUNITY): Payer: Self-pay | Admitting: *Deleted

## 2014-06-12 NOTE — ED Notes (Signed)
Pt. called and said that Tramadol is making him itch.  Dr. Konrad Dolores talked to pt.  Dr. Konrad Dolores asked me to add this to his allergies. Vassie Moselle 06/12/2014

## 2014-08-11 ENCOUNTER — Other Ambulatory Visit: Payer: Self-pay | Admitting: Physician Assistant

## 2015-06-11 DEATH — deceased
